# Patient Record
Sex: Male | Born: 1980
Health system: Southern US, Community
[De-identification: ages and names within clinical notes are randomized; demographics above are authoritative.]

## PROBLEM LIST (undated history)

## (undated) DIAGNOSIS — S3630XA Unspecified injury of stomach, initial encounter: Secondary | ICD-10-CM

## (undated) DIAGNOSIS — W3400XA Accidental discharge from unspecified firearms or gun, initial encounter: Secondary | ICD-10-CM

## (undated) DIAGNOSIS — F419 Anxiety disorder, unspecified: Secondary | ICD-10-CM

## (undated) DIAGNOSIS — R569 Unspecified convulsions: Secondary | ICD-10-CM

## (undated) DIAGNOSIS — S02609A Fracture of mandible, unspecified, initial encounter for closed fracture: Secondary | ICD-10-CM

## (undated) DIAGNOSIS — I10 Essential (primary) hypertension: Secondary | ICD-10-CM

## (undated) DIAGNOSIS — S0183XA Puncture wound without foreign body of other part of head, initial encounter: Secondary | ICD-10-CM

## (undated) DIAGNOSIS — S21239A Puncture wound without foreign body of unspecified back wall of thorax without penetration into thoracic cavity, initial encounter: Secondary | ICD-10-CM

## (undated) DIAGNOSIS — S3600XA Unspecified injury of spleen, initial encounter: Secondary | ICD-10-CM

## (undated) HISTORY — DX: Unspecified injury of spleen, initial encounter: S36.00XA

## (undated) HISTORY — DX: Fracture of mandible, unspecified, initial encounter for closed fracture: S02.609A

## (undated) HISTORY — DX: Accidental discharge from unspecified firearms or gun, initial encounter: W34.00XA

## (undated) HISTORY — DX: Puncture wound without foreign body of unspecified back wall of thorax without penetration into thoracic cavity, initial encounter: S21.239A

## (undated) HISTORY — DX: Unspecified injury of stomach, initial encounter: S36.30XA

## (undated) HISTORY — DX: Puncture wound without foreign body of other part of head, initial encounter: S01.83XA

## (undated) HISTORY — DX: Essential (primary) hypertension: I10

---

## 2013-10-30 ENCOUNTER — Emergency Department (HOSPITAL_COMMUNITY): Payer: Medicaid Other

## 2013-10-30 ENCOUNTER — Inpatient Hospital Stay (HOSPITAL_COMMUNITY)
Admission: EM | Admit: 2013-10-30 | Discharge: 2013-11-05 | DRG: 326 | Disposition: A | Discharge status: Home Health | Payer: Medicaid Other | Attending: Internal Medicine | Admitting: Internal Medicine

## 2013-10-30 DIAGNOSIS — S3630XA Unspecified injury of stomach, initial encounter: Secondary | ICD-10-CM

## 2013-10-30 DIAGNOSIS — S02609A Fracture of mandible, unspecified, initial encounter for closed fracture: Secondary | ICD-10-CM

## 2013-10-30 DIAGNOSIS — S36039A Unspecified laceration of spleen, initial encounter: Secondary | ICD-10-CM | POA: Diagnosis present

## 2013-10-30 DIAGNOSIS — F411 Generalized anxiety disorder: Secondary | ICD-10-CM | POA: Diagnosis present

## 2013-10-30 DIAGNOSIS — S0266XB Fracture of symphysis of mandible, initial encounter for open fracture: Secondary | ICD-10-CM | POA: Diagnosis present

## 2013-10-30 DIAGNOSIS — S21239A Puncture wound without foreign body of unspecified back wall of thorax without penetration into thoracic cavity, initial encounter: Secondary | ICD-10-CM

## 2013-10-30 DIAGNOSIS — W3400XA Accidental discharge from unspecified firearms or gun, initial encounter: Secondary | ICD-10-CM

## 2013-10-30 DIAGNOSIS — S3600XA Unspecified injury of spleen, initial encounter: Secondary | ICD-10-CM

## 2013-10-30 DIAGNOSIS — K560 Paralytic ileus: Secondary | ICD-10-CM | POA: Diagnosis not present

## 2013-10-30 DIAGNOSIS — S36500A Unspecified injury of ascending [right] colon, initial encounter: Principal | ICD-10-CM | POA: Diagnosis present

## 2013-10-30 DIAGNOSIS — D62 Acute posthemorrhagic anemia: Secondary | ICD-10-CM | POA: Diagnosis not present

## 2013-10-30 DIAGNOSIS — S0183XA Puncture wound without foreign body of other part of head, initial encounter: Secondary | ICD-10-CM

## 2013-10-30 DIAGNOSIS — S36509A Unspecified injury of unspecified part of colon, initial encounter: Secondary | ICD-10-CM

## 2013-10-30 HISTORY — DX: Anxiety disorder, unspecified: F41.9

## 2013-10-30 HISTORY — DX: Unspecified convulsions: R56.9

## 2013-10-30 LAB — I-STAT CHEM 8, ED
BUN: 10 mg/dL (ref 6–23)
CHLORIDE: 108 meq/L (ref 96–112)
Calcium, Ion: 1.1 mmol/L — ABNORMAL LOW (ref 1.12–1.23)
Creatinine, Ser: 1.2 mg/dL (ref 0.50–1.35)
Glucose, Bld: 177 mg/dL — ABNORMAL HIGH (ref 70–99)
HCT: 50 % (ref 39.0–52.0)
Hemoglobin: 17 g/dL (ref 13.0–17.0)
POTASSIUM: 3.3 meq/L — AB (ref 3.7–5.3)
SODIUM: 141 meq/L (ref 137–147)
TCO2: 12 mmol/L (ref 0–100)

## 2013-10-30 LAB — CBC
HEMATOCRIT: 45.7 % (ref 39.0–52.0)
HEMOGLOBIN: 15 g/dL (ref 13.0–17.0)
MCH: 26.3 pg (ref 26.0–34.0)
MCHC: 32.8 g/dL (ref 30.0–36.0)
MCV: 80 fL (ref 78.0–100.0)
Platelets: 256 10*3/uL (ref 150–400)
RBC: 5.71 MIL/uL (ref 4.22–5.81)
RDW: 14 % (ref 11.5–15.5)
WBC: 17.9 10*3/uL — AB (ref 4.0–10.5)

## 2013-10-30 LAB — I-STAT CG4 LACTIC ACID, ED: LACTIC ACID, VENOUS: 15.12 mmol/L — AB (ref 0.5–2.2)

## 2013-10-30 LAB — PROTIME-INR
INR: 0.94 (ref 0.00–1.49)
Prothrombin Time: 12.6 seconds (ref 11.6–15.2)

## 2013-10-30 MED ORDER — SUCCINYLCHOLINE CHLORIDE 20 MG/ML IJ SOLN
INTRAMUSCULAR | Status: AC | PRN
  Administered 2013-10-30: 200 mg via INTRAVENOUS

## 2013-10-30 MED ORDER — LIDOCAINE HCL (CARDIAC) 20 MG/ML IV SOLN
INTRAVENOUS | Status: AC
  Filled 2013-10-30: qty 5

## 2013-10-30 MED ORDER — IOHEXOL 300 MG/ML  SOLN: 75.0000 mL | Freq: Once | INTRAMUSCULAR | Status: AC | PRN

## 2013-10-30 MED ORDER — ETOMIDATE 2 MG/ML IV SOLN
INTRAVENOUS | Status: AC
  Filled 2013-10-30: qty 20

## 2013-10-30 MED ORDER — SUCCINYLCHOLINE CHLORIDE 20 MG/ML IJ SOLN
INTRAMUSCULAR | Status: AC
  Filled 2013-10-30: qty 1

## 2013-10-30 MED ORDER — ROCURONIUM BROMIDE 50 MG/5ML IV SOLN
INTRAVENOUS | Status: AC
  Filled 2013-10-30: qty 2

## 2013-10-30 MED ORDER — MIDAZOLAM HCL 2 MG/2ML IJ SOLN
INTRAMUSCULAR | Status: AC
  Administered 2013-10-30: 6 mg
  Filled 2013-10-30: qty 8

## 2013-10-30 MED ORDER — PROPOFOL 10 MG/ML IV EMUL
INTRAVENOUS | Status: AC
  Filled 2013-10-30: qty 100

## 2013-10-30 MED ORDER — ETOMIDATE 2 MG/ML IV SOLN
INTRAVENOUS | Status: AC | PRN
  Administered 2013-10-30: 20 mg via INTRAVENOUS

## 2013-10-30 MED ORDER — MIDAZOLAM HCL 2 MG/2ML IJ SOLN
INTRAMUSCULAR | Status: AC
  Filled 2013-10-30: qty 10

## 2013-10-30 MED ORDER — MIDAZOLAM HCL 2 MG/2ML IJ SOLN
INTRAMUSCULAR | Status: AC
  Administered 2013-10-30: 2 mg
  Filled 2013-10-30: qty 2

## 2013-10-30 MED ORDER — PROPOFOL 10 MG/ML IV EMUL
5.0000 ug/kg/min | INTRAVENOUS | Status: DC
  Administered 2013-10-30: 50 ug/kg/min via INTRAVENOUS
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
## 2013-10-31 ENCOUNTER — Inpatient Hospital Stay (HOSPITAL_COMMUNITY): Payer: Medicaid Other

## 2013-10-31 ENCOUNTER — Emergency Department (HOSPITAL_COMMUNITY): Payer: Medicaid Other | Admitting: Anesthesiology

## 2013-10-31 ENCOUNTER — Encounter (HOSPITAL_COMMUNITY): Admission: EM | Disposition: A | Discharge status: Home Health | Payer: Self-pay | Source: Home / Self Care

## 2013-10-31 ENCOUNTER — Encounter (HOSPITAL_COMMUNITY): Payer: Self-pay | Admitting: Emergency Medicine

## 2013-10-31 ENCOUNTER — Encounter (HOSPITAL_COMMUNITY): Payer: Medicaid Other | Admitting: Anesthesiology

## 2013-10-31 DIAGNOSIS — S3630XA Unspecified injury of stomach, initial encounter: Secondary | ICD-10-CM | POA: Diagnosis present

## 2013-10-31 DIAGNOSIS — S36500A Unspecified injury of ascending [right] colon, initial encounter: Secondary | ICD-10-CM | POA: Diagnosis present

## 2013-10-31 DIAGNOSIS — M795 Residual foreign body in soft tissue: Secondary | ICD-10-CM

## 2013-10-31 DIAGNOSIS — K560 Paralytic ileus: Secondary | ICD-10-CM | POA: Diagnosis not present

## 2013-10-31 DIAGNOSIS — S0266XB Fracture of symphysis of mandible, initial encounter for open fracture: Secondary | ICD-10-CM | POA: Diagnosis present

## 2013-10-31 DIAGNOSIS — F411 Generalized anxiety disorder: Secondary | ICD-10-CM | POA: Diagnosis present

## 2013-10-31 DIAGNOSIS — W3400XA Accidental discharge from unspecified firearms or gun, initial encounter: Secondary | ICD-10-CM

## 2013-10-31 DIAGNOSIS — S36030A Superficial (capsular) laceration of spleen, initial encounter: Secondary | ICD-10-CM

## 2013-10-31 DIAGNOSIS — J95821 Acute postprocedural respiratory failure: Secondary | ICD-10-CM

## 2013-10-31 DIAGNOSIS — S36039A Unspecified laceration of spleen, initial encounter: Secondary | ICD-10-CM | POA: Diagnosis present

## 2013-10-31 DIAGNOSIS — S21239A Puncture wound without foreign body of unspecified back wall of thorax without penetration into thoracic cavity, initial encounter: Secondary | ICD-10-CM

## 2013-10-31 DIAGNOSIS — D62 Acute posthemorrhagic anemia: Secondary | ICD-10-CM | POA: Diagnosis not present

## 2013-10-31 DIAGNOSIS — S31609A Unspecified open wound of abdominal wall, unspecified quadrant with penetration into peritoneal cavity, initial encounter: Secondary | ICD-10-CM

## 2013-10-31 DIAGNOSIS — S36509A Unspecified injury of unspecified part of colon, initial encounter: Secondary | ICD-10-CM

## 2013-10-31 HISTORY — PX: LAPAROTOMY: SHX154

## 2013-10-31 HISTORY — DX: Puncture wound without foreign body of unspecified back wall of thorax without penetration into thoracic cavity, initial encounter: S21.239A

## 2013-10-31 HISTORY — PX: LACERATION REPAIR: SHX5284

## 2013-10-31 LAB — CBC
HCT: 35.9 % — ABNORMAL LOW (ref 39.0–52.0)
HCT: 34.6 % — ABNORMAL LOW (ref 39.0–52.0)
HEMOGLOBIN: 12 g/dL — AB (ref 13.0–17.0)
Hemoglobin: 11.5 g/dL — ABNORMAL LOW (ref 13.0–17.0)
MCH: 25.4 pg — ABNORMAL LOW (ref 26.0–34.0)
MCH: 26 pg (ref 26.0–34.0)
MCHC: 33.4 g/dL (ref 30.0–36.0)
MCHC: 33.2 g/dL (ref 30.0–36.0)
MCV: 75.9 fL — AB (ref 78.0–100.0)
MCV: 78.1 fL (ref 78.0–100.0)
PLATELETS: 204 10*3/uL (ref 150–400)
PLATELETS: 180 10*3/uL (ref 150–400)
RBC: 4.73 MIL/uL (ref 4.22–5.81)
RBC: 4.43 MIL/uL (ref 4.22–5.81)
RDW: 13.9 % (ref 11.5–15.5)
RDW: 14.4 % (ref 11.5–15.5)
WBC: 15.2 10*3/uL — AB (ref 4.0–10.5)
WBC: 12.9 10*3/uL — AB (ref 4.0–10.5)

## 2013-10-31 LAB — MRSA PCR SCREENING: MRSA by PCR: NEGATIVE

## 2013-10-31 LAB — COMPREHENSIVE METABOLIC PANEL
ALBUMIN: 4 g/dL (ref 3.5–5.2)
ALT: 45 U/L (ref 0–53)
ALT: 69 U/L — ABNORMAL HIGH (ref 0–53)
AST: 27 U/L (ref 0–37)
AST: 52 U/L — ABNORMAL HIGH (ref 0–37)
Albumin: 3.5 g/dL (ref 3.5–5.2)
Alkaline Phosphatase: 87 U/L (ref 39–117)
Alkaline Phosphatase: 58 U/L (ref 39–117)
Anion gap: 31 — ABNORMAL HIGH (ref 5–15)
Anion gap: 17 — ABNORMAL HIGH (ref 5–15)
BILIRUBIN TOTAL: 0.3 mg/dL (ref 0.3–1.2)
BUN: 11 mg/dL (ref 6–23)
BUN: 10 mg/dL (ref 6–23)
CALCIUM: 9 mg/dL (ref 8.4–10.5)
CHLORIDE: 105 meq/L (ref 96–112)
CO2: 11 mEq/L — ABNORMAL LOW (ref 19–32)
CO2: 19 meq/L (ref 19–32)
CREATININE: 1.19 mg/dL (ref 0.50–1.35)
CREATININE: 0.99 mg/dL (ref 0.50–1.35)
Calcium: 7.7 mg/dL — ABNORMAL LOW (ref 8.4–10.5)
Chloride: 98 mEq/L (ref 96–112)
GFR calc Af Amer: >90 mL/min (ref >90)
GFR calc Af Amer: >90 mL/min (ref >90)
GFR calc non Af Amer: 78 mL/min — ABNORMAL LOW (ref >90)
Glucose, Bld: 177 mg/dL — ABNORMAL HIGH (ref 70–99)
Glucose, Bld: 159 mg/dL — ABNORMAL HIGH (ref 70–99)
Potassium: 3.5 mEq/L — ABNORMAL LOW (ref 3.7–5.3)
Potassium: 3.8 mEq/L (ref 3.7–5.3)
SODIUM: 140 meq/L (ref 137–147)
Sodium: 141 mEq/L (ref 137–147)
TOTAL PROTEIN: 8.2 g/dL (ref 6.0–8.3)
Total Bilirubin: <0.2 mg/dL — ABNORMAL LOW (ref 0.3–1.2)
Total Protein: 6.2 g/dL (ref 6.0–8.3)

## 2013-10-31 LAB — BLOOD GAS, ARTERIAL
ACID-BASE DEFICIT: 5.5 mmol/L — AB (ref 0.0–2.0)
Bicarbonate: 19.1 mEq/L — ABNORMAL LOW (ref 20.0–24.0)
DRAWN BY: 13898
FIO2: 0.4 %
LHR: 14 {breaths}/min
O2 Saturation: 96.5 %
PATIENT TEMPERATURE: 97.6
PCO2 ART: 34.9 mmHg — AB (ref 35.0–45.0)
PEEP: 5 cmH2O
TCO2: 20.2 mmol/L (ref 0–100)
VT: 650 mL
pH, Arterial: 7.355 (ref 7.350–7.450)
pO2, Arterial: 89.6 mmHg (ref 80.0–100.0)

## 2013-10-31 LAB — PREPARE FRESH FROZEN PLASMA
UNIT DIVISION: 0
UNIT DIVISION: 0

## 2013-10-31 LAB — POCT I-STAT 7, (LYTES, BLD GAS, ICA,H+H)
Acid-base deficit: 2 mmol/L (ref 0.0–2.0)
BICARBONATE: 24.6 meq/L — AB (ref 20.0–24.0)
Calcium, Ion: 1.13 mmol/L (ref 1.12–1.23)
HCT: 34 % — ABNORMAL LOW (ref 39.0–52.0)
HEMOGLOBIN: 11.6 g/dL — AB (ref 13.0–17.0)
O2 SAT: 100 %
Patient temperature: 35.6
Potassium: 3.6 mEq/L — ABNORMAL LOW (ref 3.7–5.3)
Sodium: 140 mEq/L (ref 137–147)
TCO2: 26 mmol/L (ref 0–100)
pCO2 arterial: 46.6 mmHg — ABNORMAL HIGH (ref 35.0–45.0)
pH, Arterial: 7.324 — ABNORMAL LOW (ref 7.350–7.450)
pO2, Arterial: 282 mmHg — ABNORMAL HIGH (ref 80.0–100.0)

## 2013-10-31 LAB — PROTIME-INR
INR: 1.06 (ref 0.00–1.49)
PROTHROMBIN TIME: 13.8 s (ref 11.6–15.2)

## 2013-10-31 LAB — ABO/RH: ABO/RH(D): B POS

## 2013-10-31 LAB — TRIGLYCERIDES: Triglycerides: 348 mg/dL — ABNORMAL HIGH (ref <150)

## 2013-10-31 LAB — ETHANOL: Alcohol, Ethyl (B): <11 mg/dL (ref 0–11)

## 2013-10-31 LAB — CDS SEROLOGY

## 2013-10-31 SURGERY — LAPAROTOMY, EXPLORATORY
Anesthesia: General | Site: Mouth

## 2013-10-31 MED ORDER — BIOTENE DRY MOUTH MT LIQD
15.0000 mL | Freq: Four times a day (QID) | OROMUCOSAL | Status: DC
  Administered 2013-10-31 – 2013-11-02 (×11): 15 mL via OROMUCOSAL

## 2013-10-31 MED ORDER — CEFAZOLIN SODIUM 1-5 GM-% IV SOLN
INTRAVENOUS | Status: AC
  Filled 2013-10-31: qty 50

## 2013-10-31 MED ORDER — FENTANYL CITRATE 0.05 MG/ML IJ SOLN
50.0000 ug | Freq: Once | INTRAMUSCULAR | Status: AC
  Administered 2013-10-31: 50 ug via INTRAVENOUS
  Filled 2013-10-31: qty 2

## 2013-10-31 MED ORDER — PROPOFOL 10 MG/ML IV EMUL
0.0000 ug/kg/min | INTRAVENOUS | Status: DC
  Administered 2013-10-31: 20 ug/kg/min via INTRAVENOUS
  Administered 2013-10-31: 40 ug/kg/min via INTRAVENOUS
  Filled 2013-10-31 (×2): qty 100

## 2013-10-31 MED ORDER — DEXTROSE 5 % IV SOLN
1.0000 g | Freq: Three times a day (TID) | INTRAVENOUS | Status: AC
  Administered 2013-10-31 (×3): 1 g via INTRAVENOUS
  Filled 2013-10-31 (×3): qty 1

## 2013-10-31 MED ORDER — MIDAZOLAM HCL 2 MG/2ML IJ SOLN
INTRAMUSCULAR | Status: AC
  Filled 2013-10-31: qty 2

## 2013-10-31 MED ORDER — PROPOFOL 10 MG/ML IV BOLUS
INTRAVENOUS | Status: AC
  Filled 2013-10-31: qty 20

## 2013-10-31 MED ORDER — STERILE WATER FOR INJECTION IJ SOLN
INTRAMUSCULAR | Status: AC
  Filled 2013-10-31: qty 10

## 2013-10-31 MED ORDER — PANTOPRAZOLE SODIUM 40 MG IV SOLR
40.0000 mg | Freq: Every day | INTRAVENOUS | Status: DC
  Administered 2013-10-31 – 2013-11-01 (×2): 40 mg via INTRAVENOUS
  Filled 2013-10-31 (×5): qty 40

## 2013-10-31 MED ORDER — STERILE WATER FOR INJECTION IJ SOLN
INTRAMUSCULAR | Status: AC
Start: 2013-10-31 — End: 2013-10-31
  Filled 2013-10-31: qty 10

## 2013-10-31 MED ORDER — FENTANYL BOLUS VIA INFUSION
50.0000 ug | INTRAVENOUS | Status: DC | PRN
  Filled 2013-10-31: qty 100

## 2013-10-31 MED ORDER — LACTATED RINGERS IV SOLN
INTRAVENOUS | Status: DC | PRN
  Administered 2013-10-31 (×2): via INTRAVENOUS

## 2013-10-31 MED ORDER — VECURONIUM BROMIDE 10 MG IV SOLR
INTRAVENOUS | Status: AC
  Filled 2013-10-31: qty 10

## 2013-10-31 MED ORDER — VECURONIUM BROMIDE 10 MG IV SOLR
INTRAVENOUS | Status: DC | PRN
  Administered 2013-10-31: 2 mg via INTRAVENOUS
  Administered 2013-10-31: 10 mg via INTRAVENOUS
  Administered 2013-10-31: 3 mg via INTRAVENOUS

## 2013-10-31 MED ORDER — MIDAZOLAM HCL 5 MG/5ML IJ SOLN
INTRAMUSCULAR | Status: DC | PRN
  Administered 2013-10-31 (×2): 2 mg via INTRAVENOUS

## 2013-10-31 MED ORDER — SODIUM CHLORIDE 0.9 % IV SOLN
0.0000 ug/h | INTRAVENOUS | Status: DC
  Administered 2013-10-31: 50 ug/h via INTRAVENOUS
  Filled 2013-10-31: qty 50

## 2013-10-31 MED ORDER — CEFAZOLIN SODIUM 1-5 GM-% IV SOLN
INTRAVENOUS | Status: DC | PRN
  Administered 2013-10-31: 1 g via INTRAVENOUS

## 2013-10-31 MED ORDER — FENTANYL CITRATE 0.05 MG/ML IJ SOLN
INTRAMUSCULAR | Status: AC
  Filled 2013-10-31: qty 5

## 2013-10-31 MED ORDER — PHENYLEPHRINE 40 MCG/ML (10ML) SYRINGE FOR IV PUSH (FOR BLOOD PRESSURE SUPPORT)
PREFILLED_SYRINGE | INTRAVENOUS | Status: AC
  Filled 2013-10-31: qty 10

## 2013-10-31 MED ORDER — CEFAZOLIN SODIUM-DEXTROSE 2-3 GM-% IV SOLR
INTRAVENOUS | Status: DC | PRN
  Administered 2013-10-31: 2 g via INTRAVENOUS

## 2013-10-31 MED ORDER — LIDOCAINE-EPINEPHRINE 1 %-1:100000 IJ SOLN
INTRAMUSCULAR | Status: AC
  Filled 2013-10-31: qty 1

## 2013-10-31 MED ORDER — CHLORHEXIDINE GLUCONATE 0.12 % MT SOLN
15.0000 mL | Freq: Two times a day (BID) | OROMUCOSAL | Status: DC
  Administered 2013-10-31 – 2013-11-02 (×6): 15 mL via OROMUCOSAL
  Filled 2013-10-31 (×7): qty 15

## 2013-10-31 MED ORDER — LORAZEPAM 2 MG/ML IJ SOLN: 1.0000 mg | INTRAMUSCULAR | Status: DC | PRN

## 2013-10-31 MED ORDER — FENTANYL CITRATE 0.05 MG/ML IJ SOLN
50.0000 ug | INTRAMUSCULAR | Status: DC | PRN
  Administered 2013-10-31 – 2013-11-01 (×4): 50 ug via INTRAVENOUS
  Administered 2013-11-01 – 2013-11-02 (×2): 100 ug via INTRAVENOUS
  Filled 2013-10-31 (×6): qty 2

## 2013-10-31 MED ORDER — HEMOSTATIC AGENTS (NO CHARGE) OPTIME
TOPICAL | Status: DC | PRN
  Administered 2013-10-31: 1 via TOPICAL

## 2013-10-31 MED ORDER — SODIUM CHLORIDE 0.9 % IV SOLN
INTRAVENOUS | Status: AC | PRN
  Administered 2013-10-31: 1000 mL via INTRAVENOUS

## 2013-10-31 MED ORDER — IOHEXOL 300 MG/ML  SOLN
75.0000 mL | Freq: Once | INTRAMUSCULAR | Status: AC | PRN
  Administered 2013-10-31: 75 mL via INTRAVENOUS

## 2013-10-31 MED ORDER — LIDOCAINE HCL (CARDIAC) 20 MG/ML IV SOLN
INTRAVENOUS | Status: AC
  Filled 2013-10-31: qty 5

## 2013-10-31 MED ORDER — BACITRACIN-NEOMYCIN-POLYMYXIN OINTMENT TUBE
TOPICAL_OINTMENT | Freq: Two times a day (BID) | CUTANEOUS | Status: DC
  Administered 2013-10-31: 1 via TOPICAL
  Administered 2013-10-31 – 2013-11-05 (×9): via TOPICAL
  Filled 2013-10-31 (×3): qty 15

## 2013-10-31 MED ORDER — PANTOPRAZOLE SODIUM 40 MG PO TBEC
40.0000 mg | DELAYED_RELEASE_TABLET | Freq: Every day | ORAL | Status: DC
  Administered 2013-11-02: 40 mg via ORAL
  Filled 2013-10-31: qty 1

## 2013-10-31 MED ORDER — PROPOFOL INFUSION 10 MG/ML OPTIME
INTRAVENOUS | Status: DC | PRN
  Administered 2013-10-31: 75 ug/kg/min via INTRAVENOUS

## 2013-10-31 MED ORDER — 0.9 % SODIUM CHLORIDE (POUR BTL) OPTIME
TOPICAL | Status: DC | PRN
  Administered 2013-10-31: 3000 mL

## 2013-10-31 MED ORDER — PROPOFOL 10 MG/ML IV BOLUS: INTRAVENOUS | Status: AC | PRN

## 2013-10-31 MED ORDER — ONDANSETRON HCL 4 MG/2ML IJ SOLN
INTRAMUSCULAR | Status: DC | PRN
  Administered 2013-10-31: 4 mg via INTRAVENOUS

## 2013-10-31 MED ORDER — POTASSIUM CHLORIDE IN NACL 20-0.9 MEQ/L-% IV SOLN
INTRAVENOUS | Status: DC
Start: 2013-10-31 — End: 2013-11-03
  Administered 2013-10-31 – 2013-11-03 (×8): via INTRAVENOUS
  Filled 2013-10-31 (×11): qty 1000

## 2013-10-31 MED ORDER — CEFAZOLIN SODIUM-DEXTROSE 2-3 GM-% IV SOLR
INTRAVENOUS | Status: AC
  Filled 2013-10-31: qty 50

## 2013-10-31 MED ORDER — ALBUMIN HUMAN 5 % IV SOLN
12.5000 g | Freq: Once | INTRAVENOUS | Status: AC
  Administered 2013-10-31: 12.5 g via INTRAVENOUS
  Filled 2013-10-31: qty 250

## 2013-10-31 MED ORDER — MIDAZOLAM HCL 5 MG/5ML IJ SOLN: INTRAMUSCULAR | Status: AC | PRN

## 2013-10-31 MED ORDER — LIDOCAINE-EPINEPHRINE 1 %-1:100000 IJ SOLN
INTRAMUSCULAR | Status: DC | PRN
  Administered 2013-10-31: 8 mL

## 2013-10-31 MED ORDER — ARTIFICIAL TEARS OP OINT
TOPICAL_OINTMENT | OPHTHALMIC | Status: DC | PRN
  Administered 2013-10-31: 1 via OPHTHALMIC

## 2013-10-31 MED ORDER — ALBUMIN HUMAN 5 % IV SOLN
INTRAVENOUS | Status: DC | PRN
  Administered 2013-10-31 (×2): via INTRAVENOUS

## 2013-10-31 MED ORDER — FENTANYL CITRATE 0.05 MG/ML IJ SOLN
INTRAMUSCULAR | Status: DC | PRN
  Administered 2013-10-31: 50 ug via INTRAVENOUS
  Administered 2013-10-31: 100 ug via INTRAVENOUS
  Administered 2013-10-31: 50 ug via INTRAVENOUS
  Administered 2013-10-31: 100 ug via INTRAVENOUS
  Administered 2013-10-31 (×4): 50 ug via INTRAVENOUS

## 2013-10-31 MED ORDER — LACTATED RINGERS IV SOLN
INTRAVENOUS | Status: DC | PRN
  Administered 2013-10-31 (×2): via INTRAVENOUS

## 2013-10-31 MED ORDER — PHENYLEPHRINE HCL 10 MG/ML IJ SOLN
INTRAMUSCULAR | Status: DC | PRN
  Administered 2013-10-31 (×2): 40 ug via INTRAVENOUS

## 2013-10-31 SURGICAL SUPPLY — 56 items
BLADE SURG ROTATE 9660 (MISCELLANEOUS) ×4 IMPLANT
BNDG GAUZE ELAST 4 BULKY (GAUZE/BANDAGES/DRESSINGS) ×4 IMPLANT
CANISTER SUCTION 2500CC (MISCELLANEOUS) ×8 IMPLANT
CHLORAPREP W/TINT 26ML (MISCELLANEOUS) ×4 IMPLANT
COVER MAYO STAND STRL (DRAPES) IMPLANT
COVER SURGICAL LIGHT HANDLE (MISCELLANEOUS) ×4 IMPLANT
DRAPE LAPAROSCOPIC ABDOMINAL (DRAPES) ×4 IMPLANT
DRAPE PROXIMA HALF (DRAPES) IMPLANT
DRAPE UTILITY 15X26 W/TAPE STR (DRAPE) ×8 IMPLANT
DRAPE WARM FLUID 44X44 (DRAPE) ×4 IMPLANT
DRSG OPSITE POSTOP 4X10 (GAUZE/BANDAGES/DRESSINGS) IMPLANT
DRSG OPSITE POSTOP 4X8 (GAUZE/BANDAGES/DRESSINGS) IMPLANT
DRSG PAD ABDOMINAL 8X10 ST (GAUZE/BANDAGES/DRESSINGS) ×8 IMPLANT
ELECT BLADE 6.5 EXT (BLADE) ×4 IMPLANT
ELECT CAUTERY BLADE 6.4 (BLADE) ×4 IMPLANT
ELECT REM PT RETURN 9FT ADLT (ELECTROSURGICAL) ×4
ELECTRODE REM PT RTRN 9FT ADLT (ELECTROSURGICAL) ×2 IMPLANT
GLOVE BIO SURGEON STRL SZ7 (GLOVE) ×8 IMPLANT
GLOVE BIO SURGEON STRL SZ8 (GLOVE) ×8 IMPLANT
GLOVE BIOGEL PI IND STRL 7.5 (GLOVE) ×4 IMPLANT
GLOVE BIOGEL PI IND STRL 8 (GLOVE) ×2 IMPLANT
GLOVE BIOGEL PI INDICATOR 7.5 (GLOVE) ×4
GLOVE BIOGEL PI INDICATOR 8 (GLOVE) ×2
GLOVE SURG SIGNA 7.5 PF LTX (GLOVE) ×8 IMPLANT
GOWN STRL REIN 3XL LVL4 (GOWN DISPOSABLE) ×4 IMPLANT
GOWN STRL REUS W/ TWL LRG LVL3 (GOWN DISPOSABLE) ×2 IMPLANT
GOWN STRL REUS W/TWL LRG LVL3 (GOWN DISPOSABLE) ×2
GOWN STRL REUS W/TWL XL LVL4 (GOWN DISPOSABLE) ×4 IMPLANT
KIT BASIN OR (CUSTOM PROCEDURE TRAY) ×4 IMPLANT
KIT ROOM TURNOVER OR (KITS) ×4 IMPLANT
LIGASURE IMPACT 36 18CM CVD LR (INSTRUMENTS) ×4 IMPLANT
NS IRRIG 1000ML POUR BTL (IV SOLUTION) ×12 IMPLANT
PACK GENERAL/GYN (CUSTOM PROCEDURE TRAY) ×4 IMPLANT
PAD ABD 8X10 STRL (GAUZE/BANDAGES/DRESSINGS) ×4 IMPLANT
PAD ARMBOARD 7.5X6 YLW CONV (MISCELLANEOUS) ×4 IMPLANT
PENCIL BUTTON HOLSTER BLD 10FT (ELECTRODE) IMPLANT
SPECIMEN JAR LARGE (MISCELLANEOUS) IMPLANT
SPONGE GAUZE 4X4 12PLY STER LF (GAUZE/BANDAGES/DRESSINGS) ×4 IMPLANT
SPONGE LAP 18X18 X RAY DECT (DISPOSABLE) ×8 IMPLANT
STAPLER VISISTAT 35W (STAPLE) ×4 IMPLANT
SUCTION POOLE TIP (SUCTIONS) ×4 IMPLANT
SUT PDS AB 1 TP1 96 (SUTURE) ×8 IMPLANT
SUT SILK 0 TIES 10X30 (SUTURE) ×4 IMPLANT
SUT SILK 2 0 SH CR/8 (SUTURE) ×4 IMPLANT
SUT SILK 2 0 TIES 10X30 (SUTURE) ×4 IMPLANT
SUT SILK 2 0SH CR/8 30 (SUTURE) ×4 IMPLANT
SUT SILK 3 0 SH CR/8 (SUTURE) ×8 IMPLANT
SUT SILK 3 0 TIES 10X30 (SUTURE) IMPLANT
SUT SILK 3 0SH CR/8 30 (SUTURE) ×4 IMPLANT
SUT VIC AB 3-0 SH 18 (SUTURE) IMPLANT
TAPE CLOTH SURG 4X10 WHT LF (GAUZE/BANDAGES/DRESSINGS) ×4 IMPLANT
TOWEL OR 17X26 10 PK STRL BLUE (TOWEL DISPOSABLE) ×4 IMPLANT
TRAY FOLEY CATH 16FRSI W/METER (SET/KITS/TRAYS/PACK) IMPLANT
TUBE CONNECTING 12'X1/4 (SUCTIONS)
TUBE CONNECTING 12X1/4 (SUCTIONS) IMPLANT
YANKAUER SUCT BULB TIP NO VENT (SUCTIONS) ×4 IMPLANT

## 2013-10-31 NOTE — ED Notes (Signed)
Dr. Franky Machoabbell at bedside assessing pt
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
## 2013-10-31 NOTE — ED Notes (Signed)
Duplicate versed 2mg  administration entry
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
## 2013-10-31 NOTE — Transfer of Care (Signed)
Immediate Anesthesia Transfer of Care Note  Patient: Adam Mathews  Procedure(s) Performed: Procedure(s): EXPLORATORY LAPAROTOMY, REPAIR OF COLON INJURY X 2, POSTERIOR GASTRIC INJURY X 1, AND APPLICATION OF HEMOSTATIC SPONGE TO SPLEEN (N/A) REPAIR MULTIPLE LACERATIONS (N/A)  Patient Location: ICU  Anesthesia Type:General  Level of Consciousness: Patient remains intubated per anesthesia plan  Airway & Oxygen Therapy: Patient remains intubated per anesthesia plan and Patient placed on Ventilator (see vital sign flow sheet for setting)  Post-op Assessment: Report given to PACU RN and Post -op Vital signs reviewed and stable  Post vital signs: Reviewed and stable  Complications: No apparent anesthesia complications
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
## 2013-10-31 NOTE — Procedures (Signed)
Extubation Procedure Note  Patient Details:   Name: Adam Mathews DOB: 03/03/1981 MRN: [id number]   Airway Documentation:     Evaluation  O2 sats: stable throughout Complications: No apparent complications Patient did tolerate procedure well. Bilateral Breath Sounds: Clear;Diminished Suctioning: Oral;Airway Yes  Order received for extubation.  Cuff leak positive prior to extubating. Placed on 4l  after procedure.  Patient able to vocalize, with no stridor noted.  No complications noted.  Will continue to monitor.  Lysbeth PennerSmith, Shanikka Wonders [facility] 10/31/2013, 2:41 PM
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
## 2013-10-31 NOTE — Progress Notes (Signed)
UR completed.  Eloyce Bultman, RN BSN MHA CCM Trauma/Neuro ICU Case Manager [phone]  
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
## 2013-10-31 NOTE — Anesthesia Postprocedure Evaluation (Signed)
Anesthesia Post Note  Patient: Adam Mathews  Procedure(s) Performed: Procedure(s) (LRB): EXPLORATORY LAPAROTOMY, REPAIR OF COLON INJURY X 2, POSTERIOR GASTRIC INJURY X 1, AND APPLICATION OF HEMOSTATIC SPONGE TO SPLEEN (N/A) REPAIR MULTIPLE LACERATIONS (N/A)  Anesthesia type: General  Patient location: ICU  Post pain: Pain level controlled  Post assessment: Post-op Vital signs reviewed  Last Vitals:  Filed Vitals:   10/31/13 0600  BP: 84/37  Pulse: 113  Temp:   Resp: 15    Post vital signs: stable  Level of consciousness: Patient remains intubated per anesthesia plan  Complications: No apparent anesthesia complications

## 2013-11-01 ENCOUNTER — Encounter (HOSPITAL_COMMUNITY): Payer: Self-pay

## 2013-11-01 DIAGNOSIS — F411 Generalized anxiety disorder: Secondary | ICD-10-CM

## 2013-11-01 LAB — BASIC METABOLIC PANEL
Anion gap: 14 (ref 5–15)
BUN: 8 mg/dL (ref 6–23)
CO2: 21 mEq/L (ref 19–32)
Calcium: 7.9 mg/dL — ABNORMAL LOW (ref 8.4–10.5)
Chloride: 107 mEq/L (ref 96–112)
Creatinine, Ser: 0.93 mg/dL (ref 0.50–1.35)
GFR calc non Af Amer: >90 mL/min (ref >90)
Glucose, Bld: 111 mg/dL — ABNORMAL HIGH (ref 70–99)
POTASSIUM: 4.4 meq/L (ref 3.7–5.3)
SODIUM: 142 meq/L (ref 137–147)

## 2013-11-01 LAB — CBC
HCT: 31.6 % — ABNORMAL LOW (ref 39.0–52.0)
HCT: 33.1 % — ABNORMAL LOW (ref 39.0–52.0)
Hemoglobin: 10.6 g/dL — ABNORMAL LOW (ref 13.0–17.0)
Hemoglobin: 11.1 g/dL — ABNORMAL LOW (ref 13.0–17.0)
MCH: 26.2 pg (ref 26.0–34.0)
MCH: 26.4 pg (ref 26.0–34.0)
MCHC: 33.5 g/dL (ref 30.0–36.0)
MCHC: 33.5 g/dL (ref 30.0–36.0)
MCV: 78.2 fL (ref 78.0–100.0)
MCV: 78.6 fL (ref 78.0–100.0)
PLATELETS: 154 10*3/uL (ref 150–400)
Platelets: 157 10*3/uL (ref 150–400)
RBC: 4.04 MIL/uL — ABNORMAL LOW (ref 4.22–5.81)
RBC: 4.21 MIL/uL — AB (ref 4.22–5.81)
RDW: 14.5 % (ref 11.5–15.5)
RDW: 14.6 % (ref 11.5–15.5)
WBC: 11.3 10*3/uL — ABNORMAL HIGH (ref 4.0–10.5)
WBC: 14 10*3/uL — ABNORMAL HIGH (ref 4.0–10.5)

## 2013-11-01 MED ORDER — ALPRAZOLAM 0.25 MG PO TABS
0.2500 mg | ORAL_TABLET | Freq: Two times a day (BID) | ORAL | Status: DC | PRN
  Administered 2013-11-01 – 2013-11-04 (×4): 0.25 mg via ORAL
  Filled 2013-11-01 (×4): qty 1

## 2013-11-01 MED ORDER — METOPROLOL TARTRATE 1 MG/ML IV SOLN
INTRAVENOUS | Status: AC
  Administered 2013-11-01: 5 mg via INTRAVENOUS
  Filled 2013-11-01: qty 5

## 2013-11-01 MED ORDER — METOPROLOL TARTRATE 1 MG/ML IV SOLN: 5.0000 mg | Freq: Four times a day (QID) | INTRAVENOUS | Status: DC | PRN

## 2013-11-01 MED ORDER — METOPROLOL TARTRATE 1 MG/ML IV SOLN
5.0000 mg | Freq: Once | INTRAVENOUS | Status: AC
  Administered 2013-11-01: 5 mg via INTRAVENOUS

## 2013-11-01 MED ORDER — OXYCODONE HCL 5 MG PO TABS
5.0000 mg | ORAL_TABLET | ORAL | Status: DC | PRN
  Administered 2013-11-01 – 2013-11-02 (×4): 10 mg via ORAL
  Filled 2013-11-01 (×4): qty 2
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
## 2013-11-01 NOTE — Progress Notes (Signed)
Pt HR went to 130-140s sustained, BP 168/84. MD notified. Ordered 5mg  IV lopressor once. Lopressor given. Will continue to monitor. [place], [place], RCharity fundraiser

## 2013-11-01 NOTE — Progress Notes (Signed)
Pt transferred to 3S room 8. Report given to [place] RN
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
## 2013-11-02 DIAGNOSIS — S0183XA Puncture wound without foreign body of other part of head, initial encounter: Secondary | ICD-10-CM

## 2013-11-02 DIAGNOSIS — S36509A Unspecified injury of unspecified part of colon, initial encounter: Secondary | ICD-10-CM

## 2013-11-02 DIAGNOSIS — D62 Acute posthemorrhagic anemia: Secondary | ICD-10-CM | POA: Diagnosis not present

## 2013-11-02 DIAGNOSIS — S3600XA Unspecified injury of spleen, initial encounter: Secondary | ICD-10-CM

## 2013-11-02 DIAGNOSIS — F419 Anxiety disorder, unspecified: Secondary | ICD-10-CM | POA: Insufficient documentation

## 2013-11-02 DIAGNOSIS — S02609A Fracture of mandible, unspecified, initial encounter for closed fracture: Secondary | ICD-10-CM

## 2013-11-02 DIAGNOSIS — S3630XA Unspecified injury of stomach, initial encounter: Secondary | ICD-10-CM

## 2013-11-02 DIAGNOSIS — W3400XA Accidental discharge from unspecified firearms or gun, initial encounter: Secondary | ICD-10-CM

## 2013-11-02 HISTORY — DX: Unspecified injury of spleen, initial encounter: S36.00XA

## 2013-11-02 HISTORY — DX: Puncture wound without foreign body of other part of head, initial encounter: S01.83XA

## 2013-11-02 HISTORY — DX: Unspecified injury of stomach, initial encounter: S36.30XA

## 2013-11-02 HISTORY — DX: Fracture of mandible, unspecified, initial encounter for closed fracture: S02.609A

## 2013-11-02 LAB — BASIC METABOLIC PANEL
Anion gap: 16 — ABNORMAL HIGH (ref 5–15)
BUN: 9 mg/dL (ref 6–23)
CHLORIDE: 105 meq/L (ref 96–112)
CO2: 19 mEq/L (ref 19–32)
Calcium: 8.5 mg/dL (ref 8.4–10.5)
Creatinine, Ser: 0.84 mg/dL (ref 0.50–1.35)
GFR calc non Af Amer: >90 mL/min (ref >90)
Glucose, Bld: 129 mg/dL — ABNORMAL HIGH (ref 70–99)
POTASSIUM: 5.3 meq/L (ref 3.7–5.3)
SODIUM: 140 meq/L (ref 137–147)

## 2013-11-02 LAB — CBC
HEMATOCRIT: 34 % — AB (ref 39.0–52.0)
HEMOGLOBIN: 11.1 g/dL — AB (ref 13.0–17.0)
MCH: 25.7 pg — ABNORMAL LOW (ref 26.0–34.0)
MCHC: 32.6 g/dL (ref 30.0–36.0)
MCV: 78.7 fL (ref 78.0–100.0)
Platelets: 143 10*3/uL — ABNORMAL LOW (ref 150–400)
RBC: 4.32 MIL/uL (ref 4.22–5.81)
RDW: 14.6 % (ref 11.5–15.5)
WBC: 15 10*3/uL — ABNORMAL HIGH (ref 4.0–10.5)

## 2013-11-02 LAB — TYPE AND SCREEN
ABO/RH(D): B POS
ANTIBODY SCREEN: NEGATIVE
UNIT DIVISION: 0
Unit division: 0
Unit division: 0
Unit division: 0

## 2013-11-02 MED ORDER — METOPROLOL TARTRATE 50 MG PO TABS
50.0000 mg | ORAL_TABLET | Freq: Two times a day (BID) | ORAL | Status: DC
  Administered 2013-11-02 – 2013-11-05 (×7): 50 mg via ORAL
  Filled 2013-11-02 (×9): qty 1

## 2013-11-02 MED ORDER — POLYETHYLENE GLYCOL 3350 17 G PO PACK
17.0000 g | PACK | Freq: Every day | ORAL | Status: DC
  Administered 2013-11-02 – 2013-11-04 (×3): 17 g via ORAL
  Filled 2013-11-02 (×4): qty 1

## 2013-11-02 MED ORDER — OXYCODONE HCL 5 MG PO TABS
5.0000 mg | ORAL_TABLET | ORAL | Status: DC | PRN
  Administered 2013-11-02 (×2): 10 mg via ORAL
  Administered 2013-11-03 (×3): 15 mg via ORAL
  Administered 2013-11-04: 10 mg via ORAL
  Administered 2013-11-05: 15 mg via ORAL
  Filled 2013-11-02: qty 2
  Filled 2013-11-02 (×3): qty 3
  Filled 2013-11-02: qty 2
  Filled 2013-11-02: qty 3
  Filled 2013-11-02: qty 2

## 2013-11-02 MED ORDER — MORPHINE SULFATE 2 MG/ML IJ SOLN
2.0000 mg | INTRAMUSCULAR | Status: DC | PRN
  Administered 2013-11-02 – 2013-11-04 (×3): 2 mg via INTRAVENOUS
  Filled 2013-11-02 (×3): qty 1

## 2013-11-02 MED ORDER — DOCUSATE SODIUM 100 MG PO CAPS
100.0000 mg | ORAL_CAPSULE | Freq: Two times a day (BID) | ORAL | Status: DC
  Administered 2013-11-02 – 2013-11-04 (×6): 100 mg via ORAL
  Filled 2013-11-02 (×7): qty 1

## 2013-11-02 MED ORDER — ENOXAPARIN SODIUM 40 MG/0.4ML ~~LOC~~ SOLN
40.0000 mg | Freq: Two times a day (BID) | SUBCUTANEOUS | Status: DC
  Administered 2013-11-02 – 2013-11-05 (×7): 40 mg via SUBCUTANEOUS
  Filled 2013-11-02 (×10): qty 0.4

## 2013-11-02 NOTE — Progress Notes (Signed)
Received patient from 3S. Set up password with patient.
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
## 2013-11-02 NOTE — Progress Notes (Signed)
Spoke with pt about VAC process and confirmed address and phone number listed in EPIC as correct. WOC nurse has seen pt and [facility] 1150am today.
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
## 2013-11-02 NOTE — Progress Notes (Signed)
Report called to RN, pt transferring to 6N05 via w/c with belongings.

## 2013-11-02 NOTE — Progress Notes (Signed)
WOC RN placing VAC. Wound looks good. Ileus resolving. Patient examined and I agree with the assessment and plan  Violeta GelinasBurke Sebastyan Snodgrass, MD, MPH, FACS Trauma: [phone] General Surgery: [phone]  11/02/2013 10:24 AM

## 2013-11-03 NOTE — Progress Notes (Signed)
UR completed.  [facility] and [facility] are [facility] for delivery to patient's room today. [facility]'s [facility] and insurance options.  MATCH program also explained to patient and that letter will be provided with Rx on day of discharge.   Carlyle LipaMichelle Hanif Radin, RN BSN MHA CCM Trauma/Neuro ICU Case Manager [phone]
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
## 2013-11-03 NOTE — Progress Notes (Signed)
Walked a long way and was up in the chair 2h. Active BS. Patient examined and I agree with the assessment and plan  Violeta GelinasBurke Akiva Brassfield, MD, MPH, FACS Trauma: [phone] General Surgery: [phone]  11/03/2013 4:14 PM

## 2013-11-04 MED ORDER — METOPROLOL TARTRATE 50 MG PO TABS: 50.0000 mg | ORAL_TABLET | Freq: Two times a day (BID) | ORAL | Status: DC

## 2013-11-04 MED ORDER — OXYCODONE-ACETAMINOPHEN 5-325 MG PO TABS: 1.0000 | ORAL_TABLET | ORAL | Status: DC | PRN

## 2013-11-04 MED ORDER — MAGNESIUM CITRATE PO SOLN
1.0000 | Freq: Once | ORAL | Status: AC
  Administered 2013-11-04: 1 via ORAL
  Filled 2013-11-04: qty 296

## 2013-11-04 MED ORDER — SORBITOL 70 % SOLN
960.0000 mL | TOPICAL_OIL | Freq: Once | ORAL | Status: AC
  Administered 2013-11-04: 960 mL via RECTAL
  Filled 2013-11-04: qty 240

## 2013-11-04 NOTE — Progress Notes (Signed)
Ate grits this AM. Passing flatus. +BS. Will check this PM. Patient examined and I agree with the assessment and plan  Violeta GelinasBurke Tara Rud, MD, MPH, FACS Trauma: [phone] General Surgery: [phone]  11/04/2013 10:13 AM
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
## 2013-11-05 NOTE — Discharge Summary (Signed)
Seen, agree with above.  Home today. 
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
## 2013-11-11 ENCOUNTER — Telehealth (HOSPITAL_COMMUNITY): Payer: Self-pay | Admitting: Emergency Medicine

## 2013-11-11 NOTE — Telephone Encounter (Signed)
[facility] or [facility]el under VAC sponge and change frequency of change to 2x/week.

## 2013-11-16 ENCOUNTER — Encounter (INDEPENDENT_AMBULATORY_CARE_PROVIDER_SITE_OTHER): Payer: Self-pay

## 2013-11-16 ENCOUNTER — Ambulatory Visit (INDEPENDENT_AMBULATORY_CARE_PROVIDER_SITE_OTHER): Payer: Self-pay | Admitting: Orthopedic Surgery

## 2013-11-16 VITALS — BP 156/80 | HR 80 | Temp 100.4°F | Resp 15 | Ht 72.0 in | Wt 279.0 lb

## 2013-11-16 DIAGNOSIS — Z9889 Other specified postprocedural states: Secondary | ICD-10-CM
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
## 2013-11-30 ENCOUNTER — Encounter (INDEPENDENT_AMBULATORY_CARE_PROVIDER_SITE_OTHER): Payer: Self-pay | Admitting: General Surgery

## 2013-11-30 ENCOUNTER — Ambulatory Visit (INDEPENDENT_AMBULATORY_CARE_PROVIDER_SITE_OTHER): Payer: Self-pay | Admitting: General Surgery

## 2013-11-30 VITALS — BP 130/72 | HR 118 | Temp 99.3°F | Ht 72.0 in | Wt 268.1 lb

## 2013-11-30 DIAGNOSIS — Z9889 Other specified postprocedural states: Secondary | ICD-10-CM

## 2013-11-30 DIAGNOSIS — S31109D Unspecified open wound of abdominal wall, unspecified quadrant without penetration into peritoneal cavity, subsequent encounter: Secondary | ICD-10-CM

## 2013-11-30 DIAGNOSIS — Z5189 Encounter for other specified aftercare: Secondary | ICD-10-CM

## 2013-11-30 DIAGNOSIS — S31109A Unspecified open wound of abdominal wall, unspecified quadrant without penetration into peritoneal cavity, initial encounter: Secondary | ICD-10-CM

## 2013-11-30 NOTE — Patient Instructions (Addendum)
1.  Discontinue wound vac 2.  Start packing the two holes in the midline wound with 1/4inch gauze packing.  Be sure to leave adequate tails to no loose the packing strip into the wound bed.  Then place saline soaked gauze over the rest of the midline wound, Dry 4x4 gauze on top, and an ABD with tape.  Dressings need to be done daily, more often as needed for drainage saturation.  If the outside bandage becomes saturated simply replace the outer ABD dressing and re-tape.
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
## 2013-12-07 ENCOUNTER — Telehealth (HOSPITAL_COMMUNITY): Payer: Self-pay
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
## 2013-12-21 ENCOUNTER — Encounter (INDEPENDENT_AMBULATORY_CARE_PROVIDER_SITE_OTHER): Payer: Self-pay
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
## 2014-01-06 ENCOUNTER — Ambulatory Visit: Payer: Self-pay | Attending: Family Medicine | Admitting: Family Medicine

## 2014-01-06 ENCOUNTER — Encounter: Payer: Self-pay | Admitting: Family Medicine

## 2014-01-06 VITALS — BP 154/115 | HR 111 | Temp 98.3°F | Resp 18 | Ht 72.0 in | Wt 271.0 lb

## 2014-01-06 DIAGNOSIS — I10 Essential (primary) hypertension: Secondary | ICD-10-CM

## 2014-01-06 DIAGNOSIS — S36509S Unspecified injury of unspecified part of colon, sequela: Secondary | ICD-10-CM

## 2014-01-06 MED ORDER — TRAMADOL HCL 50 MG PO TABS: 50.0000 mg | ORAL_TABLET | Freq: Three times a day (TID) | ORAL | Status: DC | PRN

## 2014-01-06 MED ORDER — METOPROLOL TARTRATE 50 MG PO TABS: 50.0000 mg | ORAL_TABLET | Freq: Two times a day (BID) | ORAL | Status: DC
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
## 2014-01-06 NOTE — Assessment & Plan Note (Signed)
A; uncontrolled due to running out of medication P: Refilled metoprolol x 90 day supply
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
## 2019-10-26 ENCOUNTER — Encounter (INDEPENDENT_AMBULATORY_CARE_PROVIDER_SITE_OTHER): Payer: Self-pay | Admitting: Primary Care

## 2019-10-26 ENCOUNTER — Other Ambulatory Visit (INDEPENDENT_AMBULATORY_CARE_PROVIDER_SITE_OTHER): Payer: Self-pay | Admitting: Primary Care

## 2019-10-26 ENCOUNTER — Other Ambulatory Visit: Payer: Self-pay

## 2019-10-26 ENCOUNTER — Ambulatory Visit (INDEPENDENT_AMBULATORY_CARE_PROVIDER_SITE_OTHER): Payer: Self-pay | Admitting: Primary Care

## 2019-10-26 VITALS — BP 174/92 | HR 109 | Temp 98.8°F | Ht 72.5 in | Wt 301.0 lb

## 2019-10-26 DIAGNOSIS — Z125 Encounter for screening for malignant neoplasm of prostate: Secondary | ICD-10-CM

## 2019-10-26 DIAGNOSIS — Z7689 Persons encountering health services in other specified circumstances: Secondary | ICD-10-CM

## 2019-10-26 DIAGNOSIS — I10 Essential (primary) hypertension: Secondary | ICD-10-CM

## 2019-10-26 MED ORDER — AMLODIPINE BESYLATE 10 MG PO TABS: 10.0000 mg | ORAL_TABLET | Freq: Every day | ORAL | 3 refills | Status: DC

## 2019-10-26 MED ORDER — HYDROCHLOROTHIAZIDE 25 MG PO TABS: 25.0000 mg | ORAL_TABLET | Freq: Every day | ORAL | 3 refills | Status: DC

## 2019-10-26 NOTE — Patient Instructions (Signed)
   Managing Your Hypertension Hypertension is commonly called high blood pressure. This is when the force of your blood pressing against the walls of your arteries is too strong. Arteries are blood vessels that carry blood from your heart throughout your body. Hypertension forces the heart to work harder to pump blood, and may cause the arteries to become narrow or stiff. Having untreated or uncontrolled hypertension can cause heart attack, stroke, kidney disease, and other problems. What are blood pressure readings? A blood pressure reading consists of a higher number over a lower number. Ideally, your blood pressure should be below 120/80. The first ("top") number is called the systolic pressure. It is a measure of the pressure in your arteries as your heart beats. The second ("bottom") number is called the diastolic pressure. It is a measure of the pressure in your arteries as the heart relaxes. What does my blood pressure reading mean? Blood pressure is classified into four stages. Based on your blood pressure reading, your health care provider may use the following stages to determine what type of treatment you need, if any. Systolic pressure and diastolic pressure are measured in a unit called mm Hg. Normal  Systolic pressure: below 120.  Diastolic pressure: below 80. Elevated  Systolic pressure: 120-129.  Diastolic pressure: below 80. Hypertension stage 1  Systolic pressure: 130-139.  Diastolic pressure: 80-89. Hypertension stage 2  Systolic pressure: 140 or above.  Diastolic pressure: 90 or above. What health risks are associated with hypertension? Managing your hypertension is an important responsibility. Uncontrolled hypertension can lead to:  A heart attack.  A stroke.  A weakened blood vessel (aneurysm).  Heart failure.  Kidney damage.  Eye damage.  Metabolic syndrome.  Memory and concentration problems. What changes can I make to manage my  hypertension? Hypertension can be managed by making lifestyle changes and possibly by taking medicines. Your health care provider will help you make a plan to bring your blood pressure within a normal range. Eating and drinking   Eat a diet that is high in fiber and potassium, and low in salt (sodium), added sugar, and fat. An example eating plan is called the DASH (Dietary Approaches to Stop Hypertension) diet. To eat this way: ? Eat plenty of fresh fruits and vegetables. Try to fill half of your plate at each meal with fruits and vegetables. ? Eat whole grains, such as whole wheat pasta, brown rice, or whole grain bread. Fill about one quarter of your plate with whole grains. ? Eat low-fat diary products. ? Avoid fatty cuts of meat, processed or cured meats, and poultry with skin. Fill about one quarter of your plate with lean proteins such as fish, chicken without skin, beans, eggs, and tofu. ? Avoid premade and processed foods. These tend to be higher in sodium, added sugar, and fat.  Reduce your daily sodium intake. Most people with hypertension should eat less than 1,500 mg of sodium a day.  Limit alcohol intake to no more than 1 drink a day for nonpregnant women and 2 drinks a day for men. One drink equals 12 oz of beer, 5 oz of wine, or 1 oz of hard liquor. Lifestyle  Work with your health care provider to maintain a healthy body weight, or to lose weight. Ask what an ideal weight is for you.  Get at least 30 minutes of exercise that causes your heart to beat faster (aerobic exercise) most days of the week. Activities may include walking, swimming, or biking.    Include exercise to strengthen your muscles (resistance exercise), such as weight lifting, as part of your weekly exercise routine. Try to do these types of exercises for 30 minutes at least 3 days a week.  Do not use any products that contain nicotine or tobacco, such as cigarettes and e-cigarettes. If you need help quitting,  ask your health care provider.  Control any long-term (chronic) conditions you have, such as high cholesterol or diabetes. Monitoring  Monitor your blood pressure at home as told by your health care provider. Your personal target blood pressure may vary depending on your medical conditions, your age, and other factors.  Have your blood pressure checked regularly, as often as told by your health care provider. Working with your health care provider  Review all the medicines you take with your health care provider because there may be side effects or interactions.  Talk with your health care provider about your diet, exercise habits, and other lifestyle factors that may be contributing to hypertension.  Visit your health care provider regularly. Your health care provider can help you create and adjust your plan for managing hypertension. Will I need medicine to control my blood pressure? Your health care provider may prescribe medicine if lifestyle changes are not enough to get your blood pressure under control, and if:  Your systolic blood pressure is 130 or higher.  Your diastolic blood pressure is 80 or higher. Take medicines only as told by your health care provider. Follow the directions carefully. Blood pressure medicines must be taken as prescribed. The medicine does not work as well when you skip doses. Skipping doses also puts you at risk for problems. Contact a health care provider if:  You think you are having a reaction to medicines you have taken.  You have repeated (recurrent) headaches.  You feel dizzy.  You have swelling in your ankles.  You have trouble with your vision. Get help right away if:  You develop a severe headache or confusion.  You have unusual weakness or numbness, or you feel faint.  You have severe pain in your chest or abdomen.  You vomit repeatedly.  You have trouble breathing. Summary  Hypertension is when the force of blood pumping  through your arteries is too strong. If this condition is not controlled, it may put you at risk for serious complications.  Your personal target blood pressure may vary depending on your medical conditions, your age, and other factors. For most people, a normal blood pressure is less than 120/80.  Hypertension is managed by lifestyle changes, medicines, or both. Lifestyle changes include weight loss, eating a healthy, low-sodium diet, exercising more, and limiting alcohol. This information is not intended to replace advice given to you by your health care provider. Make sure you discuss any questions you have with your health care provider. Document Revised: 07/16/2018 Document Reviewed: 02/20/2016 [employer]vier Patient Education  2020 [employer]  
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
## 2019-10-27 MED FILL — HYDROCHLOROTHIAZIDE 25 MG T: 25 | 30 days supply | Qty: 30 | Fill #0

## 2019-10-27 MED FILL — AMLODIPINE BESYLATE 10 MG T: 10 | 30 days supply | Qty: 30 | Fill #0

## 2019-11-16 ENCOUNTER — Ambulatory Visit (INDEPENDENT_AMBULATORY_CARE_PROVIDER_SITE_OTHER): Payer: Self-pay | Admitting: Primary Care

## 2019-12-07 MED FILL — HYDROCHLOROTHIAZIDE 25 MG T: 25 | 30 days supply | Qty: 30 | Fill #1

## 2019-12-07 MED FILL — AMLODIPINE BESYLATE 10 MG T: 10 | 30 days supply | Qty: 30 | Fill #1

## 2019-12-13 ENCOUNTER — Ambulatory Visit (INDEPENDENT_AMBULATORY_CARE_PROVIDER_SITE_OTHER): Payer: Self-pay | Admitting: Primary Care

## 2020-01-10 ENCOUNTER — Ambulatory Visit (INDEPENDENT_AMBULATORY_CARE_PROVIDER_SITE_OTHER): Payer: Self-pay | Admitting: Primary Care

## 2020-02-03 ENCOUNTER — Ambulatory Visit (INDEPENDENT_AMBULATORY_CARE_PROVIDER_SITE_OTHER): Payer: Self-pay | Admitting: Primary Care

## 2020-02-20 MED FILL — AMLODIPINE BESYLATE 10 MG T: 10 | 30 days supply | Qty: 30 | Fill #2

## 2020-02-20 MED FILL — HYDROCHLOROTHIAZIDE 25 MG T: 25 | 30 days supply | Qty: 30 | Fill #2

## 2020-02-29 ENCOUNTER — Ambulatory Visit (INDEPENDENT_AMBULATORY_CARE_PROVIDER_SITE_OTHER): Payer: Self-pay | Admitting: Primary Care

## 2020-06-28 ENCOUNTER — Ambulatory Visit (INDEPENDENT_AMBULATORY_CARE_PROVIDER_SITE_OTHER): Payer: Self-pay | Admitting: Primary Care

## 2020-06-28 ENCOUNTER — Other Ambulatory Visit: Payer: Self-pay

## 2020-06-28 ENCOUNTER — Encounter (INDEPENDENT_AMBULATORY_CARE_PROVIDER_SITE_OTHER): Payer: Self-pay | Admitting: Primary Care

## 2020-06-28 ENCOUNTER — Other Ambulatory Visit (INDEPENDENT_AMBULATORY_CARE_PROVIDER_SITE_OTHER): Payer: Self-pay | Admitting: Primary Care

## 2020-06-28 VITALS — BP 147/98 | HR 109 | Temp 97.3°F | Ht 72.5 in | Wt 296.2 lb

## 2020-06-28 DIAGNOSIS — R002 Palpitations: Secondary | ICD-10-CM

## 2020-06-28 DIAGNOSIS — Z720 Tobacco use: Secondary | ICD-10-CM

## 2020-06-28 DIAGNOSIS — F331 Major depressive disorder, recurrent, moderate: Secondary | ICD-10-CM

## 2020-06-28 DIAGNOSIS — Z13220 Encounter for screening for lipoid disorders: Secondary | ICD-10-CM

## 2020-06-28 DIAGNOSIS — I10 Essential (primary) hypertension: Secondary | ICD-10-CM

## 2020-06-28 MED ORDER — CARVEDILOL 12.5 MG PO TABS: 12.5000 mg | ORAL_TABLET | Freq: Two times a day (BID) | ORAL | 3 refills | Status: DC

## 2020-06-28 MED FILL — CARVEDILOL 12.5 MG TABLET: 12.5 | 30 days supply | Qty: 60 | Fill #0

## 2020-06-28 NOTE — Patient Instructions (Signed)

## 2020-06-28 NOTE — Addendum Note (Signed)
Addended by: Bronwen Betters on: 06/28/2020 10:47 AM   Modules accepted: Orders
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
## 2020-06-29 ENCOUNTER — Other Ambulatory Visit (INDEPENDENT_AMBULATORY_CARE_PROVIDER_SITE_OTHER): Payer: Self-pay | Admitting: Primary Care

## 2020-06-29 DIAGNOSIS — E782 Mixed hyperlipidemia: Secondary | ICD-10-CM

## 2020-06-29 LAB — CMP14+EGFR
ALT: 39 IU/L (ref 0–44)
AST: 25 IU/L (ref 0–40)
Albumin/Globulin Ratio: 1.3 (ref 1.2–2.2)
Albumin: 4.4 g/dL (ref 4.0–5.0)
Alkaline Phosphatase: 93 IU/L (ref 44–121)
BUN/Creatinine Ratio: 5 — ABNORMAL LOW (ref 9–20)
BUN: 5 mg/dL — ABNORMAL LOW (ref 6–24)
Bilirubin Total: 0.3 mg/dL (ref 0.0–1.2)
CO2: 22 mmol/L (ref 20–29)
Calcium: 9.6 mg/dL (ref 8.7–10.2)
Chloride: 101 mmol/L (ref 96–106)
Creatinine, Ser: 0.97 mg/dL (ref 0.76–1.27)
Globulin, Total: 3.4 g/dL (ref 1.5–4.5)
Glucose: 95 mg/dL (ref 65–99)
Potassium: 4.4 mmol/L (ref 3.5–5.2)
Sodium: 140 mmol/L (ref 134–144)
Total Protein: 7.8 g/dL (ref 6.0–8.5)
eGFR: 101 mL/min/{1.73_m2} (ref >59)

## 2020-06-29 LAB — CBC WITH DIFFERENTIAL/PLATELET
Basophils Absolute: 0.1 10*3/uL (ref 0.0–0.2)
Basos: 1 %
EOS (ABSOLUTE): 0.3 10*3/uL (ref 0.0–0.4)
Eos: 4 %
Hematocrit: 48.6 % (ref 37.5–51.0)
Hemoglobin: 15.7 g/dL (ref 13.0–17.7)
Immature Grans (Abs): 0 10*3/uL (ref 0.0–0.1)
Immature Granulocytes: 0 %
Lymphocytes Absolute: 2.3 10*3/uL (ref 0.7–3.1)
Lymphs: 32 %
MCH: 25.3 pg — ABNORMAL LOW (ref 26.6–33.0)
MCHC: 32.3 g/dL (ref 31.5–35.7)
MCV: 78 fL — ABNORMAL LOW (ref 79–97)
Monocytes Absolute: 0.4 10*3/uL (ref 0.1–0.9)
Monocytes: 5 %
Neutrophils Absolute: 4.1 10*3/uL (ref 1.4–7.0)
Neutrophils: 58 %
Platelets: 255 10*3/uL (ref 150–450)
RBC: 6.21 x10E6/uL — ABNORMAL HIGH (ref 4.14–5.80)
RDW: 14.9 % (ref 11.6–15.4)
WBC: 7.2 10*3/uL (ref 3.4–10.8)

## 2020-06-29 LAB — LIPID PANEL
Chol/HDL Ratio: 5 ratio (ref 0.0–5.0)
Cholesterol, Total: 226 mg/dL — ABNORMAL HIGH (ref 100–199)
HDL: 45 mg/dL (ref >39)
LDL Chol Calc (NIH): 163 mg/dL — ABNORMAL HIGH (ref 0–99)
Triglycerides: 103 mg/dL (ref 0–149)
VLDL Cholesterol Cal: 18 mg/dL (ref 5–40)

## 2020-06-29 LAB — PSA: Prostate Specific Ag, Serum: 0.6 ng/mL (ref 0.0–4.0)

## 2020-06-29 MED ORDER — PRAVASTATIN SODIUM 40 MG PO TABS
40.0000 mg | ORAL_TABLET | Freq: Every day | ORAL | 3 refills | Status: DC
Start: 2020-06-29 — End: 2020-06-29

## 2020-06-29 MED FILL — PRAVASTATIN NA 40 MG TAB: 40 | 30 days supply | Qty: 30 | Fill #0

## 2020-07-03 ENCOUNTER — Encounter (INDEPENDENT_AMBULATORY_CARE_PROVIDER_SITE_OTHER): Payer: Self-pay

## 2020-07-26 ENCOUNTER — Ambulatory Visit (INDEPENDENT_AMBULATORY_CARE_PROVIDER_SITE_OTHER): Payer: Self-pay | Admitting: Primary Care

## 2020-08-22 ENCOUNTER — Ambulatory Visit (INDEPENDENT_AMBULATORY_CARE_PROVIDER_SITE_OTHER): Payer: Self-pay | Admitting: Primary Care

## 2020-09-17 ENCOUNTER — Ambulatory Visit (INDEPENDENT_AMBULATORY_CARE_PROVIDER_SITE_OTHER): Payer: Self-pay | Admitting: Primary Care

## 2021-10-02 ENCOUNTER — Other Ambulatory Visit: Payer: Self-pay
